# Patient Record
Sex: Female | Born: 1995 | Race: Black or African American | Hispanic: No | Marital: Single | Smoking: Current every day smoker
Health system: Southern US, Community
[De-identification: ages and names within clinical notes are randomized; demographics above are authoritative.]

---
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
## 2014-01-22 ENCOUNTER — Encounter (HOSPITAL_COMMUNITY): Payer: Self-pay | Admitting: Emergency Medicine

## 2014-01-22 ENCOUNTER — Emergency Department (HOSPITAL_COMMUNITY)
Admission: EM | Admit: 2014-01-22 | Discharge: 2014-01-23 | Disposition: A | Discharge status: Home / Self Care | Payer: Self-pay | Attending: Emergency Medicine | Admitting: Emergency Medicine

## 2014-01-22 DIAGNOSIS — F17200 Nicotine dependence, unspecified, uncomplicated: Secondary | ICD-10-CM | POA: Insufficient documentation

## 2014-01-22 DIAGNOSIS — S0510XA Contusion of eyeball and orbital tissues, unspecified eye, initial encounter: Secondary | ICD-10-CM | POA: Insufficient documentation

## 2014-01-22 DIAGNOSIS — T07XXXA Unspecified multiple injuries, initial encounter: Secondary | ICD-10-CM

## 2014-01-22 DIAGNOSIS — S8000XA Contusion of unspecified knee, initial encounter: Secondary | ICD-10-CM | POA: Insufficient documentation

## 2014-01-22 DIAGNOSIS — IMO0002 Reserved for concepts with insufficient information to code with codable children: Secondary | ICD-10-CM | POA: Insufficient documentation

## 2014-01-22 DIAGNOSIS — S8001XA Contusion of right knee, initial encounter: Secondary | ICD-10-CM

## 2014-01-22 DIAGNOSIS — S0512XA Contusion of eyeball and orbital tissues, left eye, initial encounter: Secondary | ICD-10-CM
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
## 2014-01-23 MED ORDER — TETRACAINE HCL 0.5 % OP SOLN
1.0000 [drp] | Freq: Once | OPHTHALMIC | Status: AC
  Administered 2014-01-23: 1 [drp] via OPHTHALMIC
  Filled 2014-01-23: qty 2

## 2014-01-23 MED ORDER — FLUORESCEIN SODIUM 1 MG OP STRP
1.0000 | ORAL_STRIP | Freq: Once | OPHTHALMIC | Status: AC
  Administered 2014-01-23: 1 via OPHTHALMIC
  Filled 2014-01-23: qty 1

## 2014-01-23 MED ORDER — TRAMADOL HCL 50 MG PO TABS: 50.0000 mg | ORAL_TABLET | Freq: Four times a day (QID) | ORAL | Status: AC | PRN

## 2014-01-23 NOTE — Discharge Instructions (Signed)
Contusion A contusion is a deep bruise. Contusions happen when an injury causes bleeding under the skin. Signs of bruising include pain, puffiness (swelling), and discolored skin. The contusion may turn blue, purple, or yellow. HOME CARE   Put ice on the injured area.  Put ice in a plastic bag.  Place a towel between your skin and the bag.  Leave the ice on for 15-20 minutes, 03-04 times a day.  Only take medicine as told by your doctor.  Rest the injured area.  If possible, raise (elevate) the injured area to lessen puffiness. GET HELP RIGHT AWAY IF:   You have more bruising or puffiness.  You have pain that is getting worse.  Your puffiness or pain is not helped by medicine. MAKE SURE YOU:   Understand these instructions.  Will watch your condition.  Will get help right away if you are not doing well or get worse. Document Released: 11/16/2007 Document Revised: 08/22/2011 Document Reviewed: 04/04/2011 [facility] Patient Information 2015 [employer], [place]. This information is not intended to replace advice given to you by your health care provider. Make sure you discuss any questions you have with your health care provider. RICE: Routine Care for Injuries The routine care of many injuries includes Rest, Ice, Compression, and Elevation (RICE). HOME CARE INSTRUCTIONS  Rest is needed to allow your body to heal. Routine activities can usually be resumed when comfortable. Injured tendons and bones can take up to 6 weeks to heal. Tendons are the cord-like structures that attach muscle to bone.  Ice following an injury helps keep the swelling down and reduces pain.  Put ice in a plastic bag.  Place a towel between your skin and the bag.  Leave the ice on for 15-20 minutes, 3-4 times a day, or as directed by your health care provider. Do this while awake, for the first 24 to 48 hours. After that, continue as directed by your caregiver.  Compression helps keep swelling down. It  also gives support and helps with discomfort. If an elastic bandage has been applied, it should be removed and reapplied every 3 to 4 hours. It should not be applied tightly, but firmly enough to keep swelling down. Watch fingers or toes for swelling, bluish discoloration, coldness, numbness, or excessive pain. If any of these problems occur, remove the bandage and reapply loosely. Contact your caregiver if these problems continue.  Elevation helps reduce swelling and decreases pain. With extremities, such as the arms, hands, legs, and feet, the injured area should be placed near or above the level of the heart, if possible. SEEK IMMEDIATE MEDICAL CARE IF:  You have persistent pain and swelling.  You develop redness, numbness, or unexpected weakness.  Your symptoms are getting worse rather than improving after several days. These symptoms may indicate that further evaluation or further X-rays are needed. Sometimes, X-rays may not show a small broken bone (fracture) until 1 week or 10 days later. Make a follow-up appointment with your caregiver. Ask when your X-ray results will be ready. Make sure you get your X-ray results. Document Released: 09/11/2000 Document Revised: 06/04/2013 Document Reviewed: 10/29/2010 [facility] Patient Information 2015 [place], [place]. This information is not intended to replace advice given to you by your health care provider. Make sure you discuss any questions you have with your health care provider.

## 2014-01-23 NOTE — ED Provider Notes (Signed)
  Patient seen by Antony MaduraKelly Humes, PA-C See her note for  HPI, ROS, PE and Dispo and plan.  Fluorescein stain did not uptake in neither the right or left eye. No signs of global rupture,hyphema or abrasion. EOMI's are intact, pt has some mild pain with ROM of the left eye and some tenderness to palpation of the left orbit.   Tonopen reads symmetrical values at 16 right and 18 left.    Dorthula Matasiffany G Merleen Picazo, PA-C 01/23/14 0104

## 2014-01-24 NOTE — ED Provider Notes (Signed)
Medical screening examination/treatment/procedure(s) were performed by non-physician practitioner and as supervising physician I was immediately available for consultation/collaboration.   EKG Interpretation None       Miloh Alcocer, MD 01/24/14 0005 

## 2014-02-01 NOTE — ED Provider Notes (Signed)
Medical screening examination/treatment/procedure(s) were performed by non-physician practitioner and as supervising physician I was immediately available for consultation/collaboration.   EKG Interpretation None       Jaunice Mirza NanavaDerwood Kaplanti, MD 02/01/14 [phone]
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
